# Patient Record
Sex: Female | Born: 2000 | Race: Black or African American | Hispanic: No | Marital: Single | Smoking: Never smoker
Health system: Southern US, Community
[De-identification: ages and names within clinical notes are randomized; demographics above are authoritative.]

## PROBLEM LIST (undated history)

## (undated) DIAGNOSIS — S129XXA Fracture of neck, unspecified, initial encounter: Secondary | ICD-10-CM

---
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
## 2022-01-09 ENCOUNTER — Other Ambulatory Visit (HOSPITAL_COMMUNITY)
Admission: RE | Admit: 2022-01-09 | Discharge: 2022-01-09 | Disposition: A | Discharge status: Home / Self Care | Payer: 59 | Source: Ambulatory Visit | Attending: Obstetrics & Gynecology | Admitting: Obstetrics & Gynecology

## 2022-01-09 ENCOUNTER — Ambulatory Visit (INDEPENDENT_AMBULATORY_CARE_PROVIDER_SITE_OTHER): Payer: 59 | Admitting: Obstetrics & Gynecology

## 2022-01-09 VITALS — BP 106/67 | HR 101 | Wt 145.3 lb

## 2022-01-09 DIAGNOSIS — Z3492 Encounter for supervision of normal pregnancy, unspecified, second trimester: Secondary | ICD-10-CM | POA: Diagnosis not present

## 2022-01-09 DIAGNOSIS — A749 Chlamydial infection, unspecified: Secondary | ICD-10-CM

## 2022-01-09 DIAGNOSIS — Z3A36 36 weeks gestation of pregnancy: Secondary | ICD-10-CM | POA: Diagnosis not present

## 2022-01-09 DIAGNOSIS — O98813 Other maternal infectious and parasitic diseases complicating pregnancy, third trimester: Secondary | ICD-10-CM

## 2022-01-09 NOTE — Progress Notes (Signed)
   PRENATAL VISIT NOTE  Subjective:  Autumn Barr is a 21 y.o. G2P0010 at [redacted]w[redacted]d being seen today for ongoing prenatal care.  She is currently monitored for the following issues for this low-risk pregnancy and has S/P cervical spinal fusion; Supervision of low-risk pregnancy, second trimester; and Chlamydia infection affecting pregnancy in third trimester on their problem list.  Patient reports no complaints.  Contractions: Not present. Vag. Bleeding: None.  Movement: Present. Denies leaking of fluid.   The following portions of the patient's history were reviewed and updated as appropriate: allergies, current medications, past family history, past medical history, past social history, past surgical history and problem list.   Objective:   Vitals:   01/09/22 1353  BP: 106/67  Pulse: (!) 101  Weight: 145 lb 4.8 oz (65.9 kg)    Fetal Status: Fetal Heart Rate (bpm): 150 Fundal Height: 37 cm Movement: Present  Presentation: Vertex  General:  Alert, oriented and cooperative. Patient is in no acute distress.  Skin: Skin is warm and dry. No rash noted.   Cardiovascular: Normal heart rate noted  Respiratory: Normal respiratory effort, no problems with respiration noted  Abdomen: Soft, gravid, appropriate for gestational age.  Pain/Pressure: Present     Pelvic: Cervical exam performed in the presence of a chaperone Dilation: 3 Effacement (%): 70 Station: -2  Extremities: Normal range of motion.  Edema: None  Mental Status: Normal mood and affect. Normal behavior. Normal judgment and thought content.   Assessment and Plan:  Pregnancy: G2P0010 at [redacted]w[redacted]d 1. Chlamydia infection affecting pregnancy in third trimester Had negative TOC, repeat test done today.  2. [redacted] weeks gestation of pregnancy 3. Supervision of low-risk pregnancy, second trimester Pelvic cultures done today. Favorable cervix on exam. - Cervicovaginal ancillary only - Strep Gp B NAA Preterm labor symptoms and general  obstetric precautions including but not limited to vaginal bleeding, contractions, leaking of fluid and fetal movement were reviewed in detail with the patient. Please refer to After Visit Summary for other counseling recommendations.   Return in about 1 week (around 01/16/2022) for OFFICE OB VISIT (MD or APP).  Future Appointments  Date Time Provider [place]  01/09/2022  2:35 PM Osborne Oman, MD [facility]  01/16/2022  9:35 AM Griffin Basil, MD [facility]    Verita Schneiders, MD

## 2022-01-09 NOTE — Patient Instructions (Signed)
Return to office for any scheduled appointments. Call the office or go to the MAU at [facility] if: You begin to have strong, frequent contractions Your water breaks.  Sometimes it is a big gush of fluid, sometimes it is just a trickle that keeps getting your underwear wet or running down your legs You have vaginal bleeding.  It is normal to have a small amount of spotting if your cervix was checked.  You do not feel your baby moving like normal.  If you do not, get something to eat and drink and lay down and focus on feeling your baby move.   If your baby is still not moving like normal, you should call the office or go to MAU. Any other obstetric concerns.  

## 2022-01-11 LAB — CERVICOVAGINAL ANCILLARY ONLY
Chlamydia: NEGATIVE
Comment: NEGATIVE
Comment: NEGATIVE
Comment: NORMAL
Neisseria Gonorrhea: NEGATIVE
Trichomonas: NEGATIVE

## 2022-01-11 LAB — STREP GP B NAA: Strep Gp B NAA: NEGATIVE

## 2022-01-16 ENCOUNTER — Ambulatory Visit (INDEPENDENT_AMBULATORY_CARE_PROVIDER_SITE_OTHER): Payer: 59 | Admitting: Obstetrics and Gynecology

## 2022-01-16 VITALS — BP 123/69 | HR 82 | Wt 146.4 lb

## 2022-01-16 DIAGNOSIS — Z3492 Encounter for supervision of normal pregnancy, unspecified, second trimester: Secondary | ICD-10-CM

## 2022-01-16 DIAGNOSIS — Z981 Arthrodesis status: Secondary | ICD-10-CM

## 2022-01-16 DIAGNOSIS — Z3A37 37 weeks gestation of pregnancy: Secondary | ICD-10-CM

## 2022-01-16 NOTE — Progress Notes (Signed)
   PRENATAL VISIT NOTE  Subjective:  Autumn Barr is a 21 y.o. G2P0010 at [redacted]w[redacted]d being seen today for ongoing prenatal care.  She is currently monitored for the following issues for this low-risk pregnancy and has S/P cervical spinal fusion; Supervision of low-risk pregnancy, second trimester; and Chlamydia infection affecting pregnancy in third trimester on their problem list.  Patient doing well with no acute concerns today. She reports occasional contractions.  Contractions: Irritability. Vag. Bleeding: None.  Movement: Present. Denies leaking of fluid.   The following portions of the patient's history were reviewed and updated as appropriate: allergies, current medications, past family history, past medical history, past social history, past surgical history and problem list. Problem list updated.  Objective:   Vitals:   01/16/22 1018  BP: 123/69  Pulse: 82  Weight: 146 lb 6.4 oz (66.4 kg)    Fetal Status: Fetal Heart Rate (bpm): 155 Fundal Height: 38 cm Movement: Present     General:  Alert, oriented and cooperative. Patient is in no acute distress.  Skin: Skin is warm and dry. No rash noted.   Cardiovascular: Normal heart rate noted  Respiratory: Normal respiratory effort, no problems with respiration noted  Abdomen: Soft, gravid, appropriate for gestational age.  Pain/Pressure: Present     Pelvic: Cervical exam deferred        Extremities: Normal range of motion.  Edema: None  Mental Status:  Normal mood and affect. Normal behavior. Normal judgment and thought content.   Assessment and Plan:  Pregnancy: G2P0010 at [redacted]w[redacted]d  1. [redacted] weeks gestation of pregnancy   2. S/P cervical spinal fusion Discuss with anesthesia upon admission, should be no issue since it does not involve lumbar spine  3. Supervision of low-risk pregnancy, second trimester Continue routine prenatal care  Term labor symptoms and general obstetric precautions including but not limited to vaginal  bleeding, contractions, leaking of fluid and fetal movement were reviewed in detail with the patient.  Please refer to After Visit Summary for other counseling recommendations.   Return in about 1 week (around 01/23/2022) for ROB, in person.   Lynnda Shields, MD Faculty Attending Center for [facility]

## 2022-01-24 ENCOUNTER — Encounter: Payer: Self-pay | Admitting: Obstetrics & Gynecology

## 2022-01-24 ENCOUNTER — Other Ambulatory Visit: Payer: Self-pay

## 2022-01-24 ENCOUNTER — Encounter: Payer: Self-pay | Admitting: Family Medicine

## 2022-01-24 ENCOUNTER — Ambulatory Visit (INDEPENDENT_AMBULATORY_CARE_PROVIDER_SITE_OTHER): Payer: 59 | Admitting: Obstetrics & Gynecology

## 2022-01-24 VITALS — BP 117/72 | HR 82 | Wt 154.6 lb

## 2022-01-24 DIAGNOSIS — Z3493 Encounter for supervision of normal pregnancy, unspecified, third trimester: Secondary | ICD-10-CM

## 2022-01-24 DIAGNOSIS — Z3A38 38 weeks gestation of pregnancy: Secondary | ICD-10-CM

## 2022-01-24 NOTE — Progress Notes (Signed)
   PRENATAL VISIT NOTE  Subjective:  Autumn Barr is a 21 y.o. G2P0010 at [redacted]w[redacted]d being seen today for ongoing prenatal care.  She is currently monitored for the following issues for this low-risk pregnancy and has S/P cervical spinal fusion; Supervision of low-risk pregnancy, third trimester; and Chlamydia infection affecting pregnancy in third trimester on their problem list.  Patient reports occasional contractions.  Contractions: Irritability. Vag. Bleeding: None.  Movement: Present. Denies leaking of fluid.   The following portions of the patient's history were reviewed and updated as appropriate: allergies, current medications, past family history, past medical history, past social history, past surgical history and problem list.   Objective:   Vitals:   01/24/22 0916  BP: 117/72  Pulse: 82  Weight: 154 lb 9.6 oz (70.1 kg)    Fetal Status: Fetal Heart Rate (bpm): 154 Fundal Height: 38 cm Movement: Present  Presentation: Vertex  General:  Alert, oriented and cooperative. Patient is in no acute distress.  Skin: Skin is warm and dry. No rash noted.   Cardiovascular: Normal heart rate noted  Respiratory: Normal respiratory effort, no problems with respiration noted  Abdomen: Soft, gravid, appropriate for gestational age.  Pain/Pressure: Present     Pelvic: Cervical exam performed in the presence of a chaperone Dilation: 3 Effacement (%): 70 Station: -2  Extremities: Normal range of motion.     Mental Status: Normal mood and affect. Normal behavior. Normal judgment and thought content.   Assessment and Plan:  Pregnancy: G2P0010 at [redacted]w[redacted]d 1. [redacted] weeks gestation of pregnancy 2. Supervision of low-risk pregnancy, third trimester Favorable cervix today, patient reassured. Labor symptoms and general obstetric precautions including but not limited to vaginal bleeding, contractions, leaking of fluid and fetal movement were reviewed in detail with the patient. Please refer to After  Visit Summary for other counseling recommendations.   Return in about 1 week (around 01/31/2022) for OFFICE OB VISIT (MD or APP).  No future appointments.  Verita Schneiders, MD

## 2022-01-24 NOTE — Patient Instructions (Signed)
Return to office for any scheduled appointments. Call the office or go to the MAU at [facility] if: You begin to have strong, frequent contractions Your water breaks.  Sometimes it is a big gush of fluid, sometimes it is just a trickle that keeps getting your underwear wet or running down your legs You have vaginal bleeding.  It is normal to have a small amount of spotting if your cervix was checked.  You do not feel your baby moving like normal.  If you do not, get something to eat and drink and lay down and focus on feeling your baby move.   If your baby is still not moving like normal, you should call the office or go to MAU. Any other obstetric concerns.  

## 2022-01-31 NOTE — Progress Notes (Signed)
   PRENATAL VISIT NOTE  Subjective:  Autumn Barr is a 21 y.o. G2P0010 at [redacted]w[redacted]d being seen today for ongoing prenatal care.  She is currently monitored for the following issues for this high-risk pregnancy and has S/P cervical spinal fusion; Supervision of low-risk pregnancy, third trimester; and Chlamydia infection affecting pregnancy in third trimester on their problem list.  Patient reports occasional contractions.  Contractions: Irregular. Vag. Bleeding: None.  Movement: Present. Denies leaking of fluid.   The following portions of the patient's history were reviewed and updated as appropriate: allergies, current medications, past family history, past medical history, past social history, past surgical history and problem list.   Objective:   Vitals:   02/01/22 1034  BP: 121/64  Pulse: 74  Weight: 156 lb 11.2 oz (71.1 kg)    Fetal Status: Fetal Heart Rate (bpm): 154   Movement: Present    Vtx 40 cm   General:  Alert, oriented and cooperative. Patient is in no acute distress.  Skin: Skin is warm and dry. No rash noted.   Cardiovascular: Normal heart rate noted  Respiratory: Normal respiratory effort, no problems with respiration noted  Abdomen: Soft, gravid, appropriate for gestational age.  Pain/Pressure: Present     Pelvic: Cervical exam deferred        Extremities: Normal range of motion.  Edema: Mild pitting, slight indentation  Mental Status: Normal mood and affect. Normal behavior. Normal judgment and thought content.   Assessment and Plan:  Pregnancy: G2P0010 at [redacted]w[redacted]d 1. S/P cervical spinal fusion - Op note reviewed. No restrictions.   2. Chlamydia infection affecting pregnancy in third trimester - TOC neg  3. Supervision of low-risk pregnancy, third trimester   4. [redacted] weeks gestation of pregnancy - IOL 11/20 at 41 weeks  Term labor symptoms and general obstetric precautions including but not limited to vaginal bleeding, contractions, leaking of fluid and  fetal movement were reviewed in detail with the patient. Please refer to After Visit Summary for other counseling recommendations.   No follow-ups on file.  Future Appointments  Date Time Provider Department Center  02/07/2022  8:55 AM Anyanwu, Jethro Bastos, MD [facility]  02/07/2022 10:15 AM [facility]    Dorathy Kinsman, CNM

## 2022-02-01 ENCOUNTER — Ambulatory Visit (INDEPENDENT_AMBULATORY_CARE_PROVIDER_SITE_OTHER): Payer: 59 | Admitting: Advanced Practice Midwife

## 2022-02-01 ENCOUNTER — Other Ambulatory Visit: Payer: Self-pay

## 2022-02-01 VITALS — BP 121/64 | HR 74 | Wt 156.7 lb

## 2022-02-01 DIAGNOSIS — O98813 Other maternal infectious and parasitic diseases complicating pregnancy, third trimester: Secondary | ICD-10-CM

## 2022-02-01 DIAGNOSIS — A749 Chlamydial infection, unspecified: Secondary | ICD-10-CM

## 2022-02-01 DIAGNOSIS — Z981 Arthrodesis status: Secondary | ICD-10-CM

## 2022-02-01 DIAGNOSIS — Z3A39 39 weeks gestation of pregnancy: Secondary | ICD-10-CM

## 2022-02-01 DIAGNOSIS — Z3493 Encounter for supervision of normal pregnancy, unspecified, third trimester: Secondary | ICD-10-CM

## 2022-02-01 NOTE — Addendum Note (Signed)
Addended by: Dorathy Kinsman on: 02/01/2022 12:14 PM   Modules accepted: Orders

## 2022-02-01 NOTE — Patient Instructions (Signed)
[email or web address]  

## 2022-02-05 ENCOUNTER — Inpatient Hospital Stay (HOSPITAL_COMMUNITY)
Admission: AD | Admit: 2022-02-05 | Discharge: 2022-02-07 | DRG: 806 | Disposition: A | Discharge status: Home / Self Care | Payer: 59 | Attending: Obstetrics and Gynecology | Admitting: Obstetrics and Gynecology

## 2022-02-05 ENCOUNTER — Inpatient Hospital Stay (HOSPITAL_COMMUNITY): Payer: 59 | Admitting: Anesthesiology

## 2022-02-05 ENCOUNTER — Other Ambulatory Visit: Payer: Self-pay

## 2022-02-05 ENCOUNTER — Encounter (HOSPITAL_COMMUNITY): Payer: Self-pay | Admitting: Family Medicine

## 2022-02-05 DIAGNOSIS — A749 Chlamydial infection, unspecified: Secondary | ICD-10-CM

## 2022-02-05 DIAGNOSIS — D62 Acute posthemorrhagic anemia: Secondary | ICD-10-CM | POA: Diagnosis not present

## 2022-02-05 DIAGNOSIS — O139 Gestational [pregnancy-induced] hypertension without significant proteinuria, unspecified trimester: Secondary | ICD-10-CM | POA: Diagnosis not present

## 2022-02-05 DIAGNOSIS — O1404 Mild to moderate pre-eclampsia, complicating childbirth: Secondary | ICD-10-CM | POA: Diagnosis present

## 2022-02-05 DIAGNOSIS — Z3A40 40 weeks gestation of pregnancy: Secondary | ICD-10-CM

## 2022-02-05 DIAGNOSIS — O1495 Unspecified pre-eclampsia, complicating the puerperium: Secondary | ICD-10-CM

## 2022-02-05 DIAGNOSIS — O9081 Anemia of the puerperium: Secondary | ICD-10-CM | POA: Diagnosis not present

## 2022-02-05 DIAGNOSIS — O480 Post-term pregnancy: Secondary | ICD-10-CM | POA: Diagnosis present

## 2022-02-05 DIAGNOSIS — O164 Unspecified maternal hypertension, complicating childbirth: Secondary | ICD-10-CM | POA: Diagnosis not present

## 2022-02-05 DIAGNOSIS — O368130 Decreased fetal movements, third trimester, not applicable or unspecified: Secondary | ICD-10-CM | POA: Diagnosis not present

## 2022-02-05 DIAGNOSIS — Z3493 Encounter for supervision of normal pregnancy, unspecified, third trimester: Principal | ICD-10-CM

## 2022-02-05 DIAGNOSIS — O98813 Other maternal infectious and parasitic diseases complicating pregnancy, third trimester: Secondary | ICD-10-CM

## 2022-02-05 LAB — CBC
HCT: 32.5 % — ABNORMAL LOW (ref 36.0–46.0)
HCT: 30.2 % — ABNORMAL LOW (ref 36.0–46.0)
Hemoglobin: 10.4 g/dL — ABNORMAL LOW (ref 12.0–15.0)
Hemoglobin: 9.8 g/dL — ABNORMAL LOW (ref 12.0–15.0)
MCH: 26.8 pg (ref 26.0–34.0)
MCH: 27 pg (ref 26.0–34.0)
MCHC: 32 g/dL (ref 30.0–36.0)
MCHC: 32.5 g/dL (ref 30.0–36.0)
MCV: 83.8 fL (ref 80.0–100.0)
MCV: 83.2 fL (ref 80.0–100.0)
Platelets: 226 10*3/uL (ref 150–400)
Platelets: 206 10*3/uL (ref 150–400)
RBC: 3.88 MIL/uL (ref 3.87–5.11)
RBC: 3.63 MIL/uL — ABNORMAL LOW (ref 3.87–5.11)
RDW: 14.8 % (ref 11.5–15.5)
RDW: 14.7 % (ref 11.5–15.5)
WBC: 8.2 10*3/uL (ref 4.0–10.5)
WBC: 8.5 10*3/uL (ref 4.0–10.5)
nRBC: 0 % (ref 0.0–0.2)
nRBC: 0 % (ref 0.0–0.2)

## 2022-02-05 LAB — URINALYSIS, ROUTINE W REFLEX MICROSCOPIC
Bilirubin Urine: NEGATIVE
Glucose, UA: NEGATIVE mg/dL
Hgb urine dipstick: NEGATIVE
Ketones, ur: NEGATIVE mg/dL
Nitrite: NEGATIVE
Protein, ur: 100 mg/dL — AB
Specific Gravity, Urine: 1.027 (ref 1.005–1.030)
pH: 5 (ref 5.0–8.0)

## 2022-02-05 LAB — COMPREHENSIVE METABOLIC PANEL
ALT: 19 U/L (ref 0–44)
AST: 30 U/L (ref 15–41)
Albumin: 3.2 g/dL — ABNORMAL LOW (ref 3.5–5.0)
Alkaline Phosphatase: 135 U/L — ABNORMAL HIGH (ref 38–126)
Anion gap: 11 (ref 5–15)
BUN: 8 mg/dL (ref 6–20)
CO2: 18 mmol/L — ABNORMAL LOW (ref 22–32)
Calcium: 9.2 mg/dL (ref 8.9–10.3)
Chloride: 107 mmol/L (ref 98–111)
Creatinine, Ser: 0.72 mg/dL (ref 0.44–1.00)
GFR, Estimated: >60 mL/min (ref >60)
Glucose, Bld: 76 mg/dL (ref 70–99)
Potassium: 4 mmol/L (ref 3.5–5.1)
Sodium: 136 mmol/L (ref 135–145)
Total Bilirubin: 0.5 mg/dL (ref 0.3–1.2)
Total Protein: 6.3 g/dL — ABNORMAL LOW (ref 6.5–8.1)

## 2022-02-05 LAB — PROTEIN / CREATININE RATIO, URINE
Creatinine, Urine: 223 mg/dL
Protein Creatinine Ratio: 0.31 mg/mg{Cre} — ABNORMAL HIGH (ref 0.00–0.15)
Total Protein, Urine: 69 mg/dL

## 2022-02-05 MED ORDER — LACTATED RINGERS IV SOLN: 500.0000 mL | INTRAVENOUS | Status: DC | PRN

## 2022-02-05 MED ORDER — PHENYLEPHRINE 80 MCG/ML (10ML) SYRINGE FOR IV PUSH (FOR BLOOD PRESSURE SUPPORT): 80.0000 ug | PREFILLED_SYRINGE | INTRAVENOUS | Status: DC | PRN

## 2022-02-05 MED ORDER — LIDOCAINE HCL (PF) 1 % IJ SOLN
30.0000 mL | INTRAMUSCULAR | Status: AC | PRN
  Administered 2022-02-06: 30 mL via SUBCUTANEOUS
  Filled 2022-02-05: qty 30

## 2022-02-05 MED ORDER — EPHEDRINE 5 MG/ML INJ: 10.0000 mg | INTRAVENOUS | Status: DC | PRN

## 2022-02-05 MED ORDER — DIPHENHYDRAMINE HCL 50 MG/ML IJ SOLN: 12.5000 mg | INTRAMUSCULAR | Status: DC | PRN

## 2022-02-05 MED ORDER — ONDANSETRON HCL 4 MG/2ML IJ SOLN: 4.0000 mg | Freq: Four times a day (QID) | INTRAMUSCULAR | Status: DC | PRN

## 2022-02-05 MED ORDER — ACETAMINOPHEN 325 MG PO TABS: 650.0000 mg | ORAL_TABLET | ORAL | Status: DC | PRN

## 2022-02-05 MED ORDER — OXYTOCIN-SODIUM CHLORIDE 30-0.9 UT/500ML-% IV SOLN
2.5000 [IU]/h | INTRAVENOUS | Status: DC
  Administered 2022-02-06: 2.5 [IU]/h via INTRAVENOUS

## 2022-02-05 MED ORDER — FENTANYL-BUPIVACAINE-NACL 0.5-0.125-0.9 MG/250ML-% EP SOLN
12.0000 mL/h | EPIDURAL | Status: DC | PRN
  Administered 2022-02-05: 12 mL/h via EPIDURAL
  Filled 2022-02-05: qty 250

## 2022-02-05 MED ORDER — OXYTOCIN-SODIUM CHLORIDE 30-0.9 UT/500ML-% IV SOLN
1.0000 m[IU]/min | INTRAVENOUS | Status: DC
  Administered 2022-02-05: 2 m[IU]/min via INTRAVENOUS
  Filled 2022-02-05: qty 500

## 2022-02-05 MED ORDER — OXYTOCIN BOLUS FROM INFUSION
333.0000 mL | Freq: Once | INTRAVENOUS | Status: AC
  Administered 2022-02-06: 333 mL via INTRAVENOUS

## 2022-02-05 MED ORDER — OXYCODONE-ACETAMINOPHEN 5-325 MG PO TABS: 2.0000 | ORAL_TABLET | ORAL | Status: DC | PRN

## 2022-02-05 MED ORDER — LIDOCAINE HCL (PF) 1 % IJ SOLN
INTRAMUSCULAR | Status: DC | PRN
  Administered 2022-02-05: 8 mL via EPIDURAL

## 2022-02-05 MED ORDER — SOD CITRATE-CITRIC ACID 500-334 MG/5ML PO SOLN: 30.0000 mL | ORAL | Status: DC | PRN

## 2022-02-05 MED ORDER — FENTANYL-BUPIVACAINE-NACL 0.5-0.125-0.9 MG/250ML-% EP SOLN: 12.0000 mL/h | EPIDURAL | Status: DC | PRN

## 2022-02-05 MED ORDER — LACTATED RINGERS IV SOLN: INTRAVENOUS | Status: DC

## 2022-02-05 MED ORDER — FLEET ENEMA 7-19 GM/118ML RE ENEM: 1.0000 | ENEMA | RECTAL | Status: DC | PRN

## 2022-02-05 MED ORDER — TERBUTALINE SULFATE 1 MG/ML IJ SOLN: 0.2500 mg | Freq: Once | INTRAMUSCULAR | Status: DC | PRN

## 2022-02-05 MED ORDER — FENTANYL CITRATE (PF) 100 MCG/2ML IJ SOLN
100.0000 ug | INTRAMUSCULAR | Status: DC | PRN
  Administered 2022-02-05: 100 ug via INTRAVENOUS
  Filled 2022-02-05: qty 2

## 2022-02-05 MED ORDER — OXYCODONE-ACETAMINOPHEN 5-325 MG PO TABS: 1.0000 | ORAL_TABLET | ORAL | Status: DC | PRN

## 2022-02-05 MED ORDER — LACTATED RINGERS IV SOLN
500.0000 mL | Freq: Once | INTRAVENOUS | Status: AC
  Administered 2022-02-05: 500 mL via INTRAVENOUS

## 2022-02-05 NOTE — MAU Provider Note (Signed)
Event Date/Time   First Provider Initiated Contact with Patient 02/05/22 1141       S: Ms. DELANO SCARDINO is a 21 y.o. G2P0010 at [redacted]w[redacted]d  who presents to MAU today complaining of regular painful contractions since this am. She is unsure how often. She denies vaginal bleeding. She denies LOF. She reports decreased fetal movement since yesterday. Has not push on her abdomen to make the baby move.  O: BP 129/78   Pulse 81   Temp 98.2 F (36.8 C) (Oral)   Resp 16   Ht 5\' 2"  (1.575 m)   Wt 72.7 kg   LMP 04/30/2021   SpO2 98% Comment: room air  BMI 29.32 kg/m  GENERAL: Well-developed, well-nourished female in no acute distress.  HEAD: Normocephalic, atraumatic.  CHEST: Normal effort of breathing, regular heart rate  Cervical exam:  Dilation: 3 Effacement (%): 60 Cervical Position: Anterior Station: -3 Presentation: Vertex Exam by:: E Johnson RN  Fetal Monitoring: Baseline: 130 Variability: mod Accelerations: no Decelerations: no Contractions: q1-3  A: SIUP at [redacted]w[redacted]d  Non-reactive NST Decreased FM  P: Admit to LD IOL Mngt per labor  [redacted]w[redacted]d, CNM 02/05/2022 11:47 AM

## 2022-02-05 NOTE — Anesthesia Procedure Notes (Signed)
Epidural Patient location during procedure: OB Start time: 02/05/2022 11:23 PM End time: 02/05/2022 11:27 PM  Staffing Anesthesiologist: Bethena Midget, MD  Preanesthetic Checklist Completed: patient identified, IV checked, site marked, risks and benefits discussed, surgical consent, monitors and equipment checked, pre-op evaluation and timeout performed  Epidural Patient position: sitting Prep: DuraPrep and site prepped and draped Patient monitoring: continuous pulse ox and blood pressure Approach: midline Location: L3-L4 Injection technique: LOR air  Needle:  Needle type: Tuohy  Needle gauge: 17 G Needle length: 9 cm and 9 Needle insertion depth: 5 cm Catheter type: closed end flexible Catheter size: 19 Gauge Catheter at skin depth: 10 cm Test dose: negative  Assessment Events: blood not aspirated, injection not painful, no injection resistance, no paresthesia and negative IV test

## 2022-02-05 NOTE — H&P (Addendum)
LABOR AND DELIVERY ADMISSION HISTORY AND PHYSICAL NOTE  Autumn Barr is a 21 y.o. female G2P0010 with IUP at [redacted]w[redacted]d by LMP presenting for IOL for non-reactive NST and decreased fetal movement.  She reports contractions every 15 minutes. Baby is moving again since this AM. She denies leakage of fluid or vaginal bleeding.  Prenatal History/Complications: PNC at [facility] Pregnancy complications:  - Late to prenatal care - Chlamydia in 3rd trimester  Past Medical History: Past Medical History:  Diagnosis Date   Cervical spine fracture (HCC) 07/27/2020   Closed cervical spine fracture [facility])     Past Surgical History: Past Surgical History:  Procedure Laterality Date   ANTERIOR CERVICAL DECOMP/DISCECTOMY FUSION N/A 07/28/2020   Procedure: ANTERIOR CERVICAL DECOMPRESSION/DISCECTOMY FUSION CERVICAL FIVE-SIX;  Surgeon: Tia Alert, MD;  Location: [facility];  Service: Neurosurgery;  Laterality: N/A;    Obstetrical History: OB History     Gravida  2   Para  0   Term  0   Preterm  0   AB  1   Living  0      SAB  1   IAB  0   Ectopic  0   Multiple  0   Live Births  0           Social History: Social History   Socioeconomic History   Marital status: Single    Spouse name: Not on file   Number of children: Not on file   Years of education: Not on file   Highest education level: Not on file  Occupational History   Not on file  Tobacco Use   Smoking status: Never   Smokeless tobacco: Never  Vaping Use   Vaping Use: Never used  Substance and Sexual Activity   Alcohol use: Not Currently   Drug use: Not Currently    Types: Marijuana   Sexual activity: Yes    Birth control/protection: None  Other Topics Concern   Not on file  Social History Narrative   Not on file   Social Determinants of Health   Financial Resource Strain: Not on file  Food Insecurity: No Food Insecurity (02/05/2022)   Hunger Vital Sign    Worried About Running Out of Food in the Last  Year: Never true    Ran Out of Food in the Last Year: Never true  Transportation Needs: No Transportation Needs (02/05/2022)   PRAPARE - Administrator, Civil Service (Medical): No    Lack of Transportation (Non-Medical): No  Physical Activity: Not on file  Stress: Not on file  Social Connections: Not on file    Family History: Family History  Problem Relation Age of Onset   Diabetes Maternal Grandmother     Allergies: No Known Allergies  Medications Prior to Admission  Medication Sig Dispense Refill Last Dose   prenatal vitamin w/FE, FA (PRENATAL 1 + 1) 27-1 MG TABS tablet Take 1 tablet by mouth daily at 12 noon. 30 tablet 11 02/04/2022 at 0900     Review of Systems  All systems reviewed and negative except as stated in HPI  Physical Exam Blood pressure (!) 136/92, pulse 70, temperature 97.9 F (36.6 C), temperature source Oral, resp. rate 18, height 5\' 2"  (1.575 m), weight 72.7 kg, last menstrual period 04/30/2021, SpO2 98 %. General appearance: alert, oriented, NAD Lungs: normal respiratory effort Heart: good peripheral perfusion Abdomen: soft, non-tender; gravid, FH appropriate for GA Extremities: No calf swelling or tenderness Presentation: cephalic Fetal  monitoring: 135/moderate variability/15x15 accels, no decels Uterine activity: CTX q 1-5 min Dilation: 3 Effacement (%): 80 Station: -2 Exam by:: Dr. Westley Chandler  Prenatal labs: ABO, Rh: --/--/A POS (11/14 1201) Antibody: NEG (11/14 1201) Rubella: 2.33 (07/14 1004) RPR: Non Reactive (08/24 1222)  HBsAg: Negative (07/14 1004)  HIV: Non Reactive (08/24 1222)  GC/Chlamydia: Neg/Neg after TOC GBS: Negative/-- (10/18 1700)  2-hr GTT: nml Genetic screening:   NIPS:   Low risk female AFP:   Too late Horizon:Neg x 4 Anatomy US: wnml  Prenatal Transfer Tool  Maternal Diabetes: No Genetic Screening: Normal Maternal Ultrasounds/Referrals: Normal Fetal Ultrasounds or other Referrals:  Referred to  Materal Fetal Medicine , Other: for f/u on anatomy US. On initial anatomy scan, was concerned about bowel and could not visualize. Follow up ultrasound was normal Maternal Substance Abuse:  No Significant Maternal Medications:  None Significant Maternal Lab Results: Group B Strep negative  Results for orders placed or performed during the hospital encounter of 02/05/22 (from the past 24 hour(s))  Urinalysis, Routine w reflex microscopic Urine, Clean Catch   Collection Time: 02/05/22 11:23 AM  Result Value Ref Range   Color, Urine YELLOW YELLOW   APPearance CLOUDY (A) CLEAR   Specific Gravity, Urine 1.027 1.005 - 1.030   pH 5.0 5.0 - 8.0   Glucose, UA NEGATIVE NEGATIVE mg/dL   Hgb urine dipstick NEGATIVE NEGATIVE   Bilirubin Urine NEGATIVE NEGATIVE   Ketones, ur NEGATIVE NEGATIVE mg/dL   Protein, ur 350 (A) NEGATIVE mg/dL   Nitrite NEGATIVE NEGATIVE   Leukocytes,Ua TRACE (A) NEGATIVE   RBC / HPF 0-5 0 - 5 RBC/hpf   WBC, UA 11-20 0 - 5 WBC/hpf   Bacteria, UA MANY (A) NONE SEEN   Squamous Epithelial / LPF 11-20 0 - 5   Mucus PRESENT   CBC   Collection Time: 02/05/22 12:01 PM  Result Value Ref Range   WBC 8.2 4.0 - 10.5 K/uL   RBC 3.88 3.87 - 5.11 MIL/uL   Hemoglobin 10.4 (L) 12.0 - 15.0 g/dL   HCT 09.3 (L) 81.8 - 29.9 %   MCV 83.8 80.0 - 100.0 fL   MCH 26.8 26.0 - 34.0 pg   MCHC 32.0 30.0 - 36.0 g/dL   RDW 37.1 69.6 - 78.9 %   Platelets 226 150 - 400 K/uL   nRBC 0.0 0.0 - 0.2 %  Type and screen   Collection Time: 02/05/22 12:01 PM  Result Value Ref Range   ABO/RH(D) A POS    Antibody Screen NEG    Sample Expiration      02/08/2022,2359 Performed at [facility], [address], [place], [place] [address]     Patient Active Problem List   Diagnosis Date Noted   Post-dates pregnancy 02/05/2022   Chlamydia infection affecting pregnancy in third trimester 11/15/2021   Supervision of low-risk pregnancy, third trimester 10/05/2021   S/P cervical spinal fusion  07/28/2020    Assessment: Autumn Barr is a 21 y.o. G2P0010 at [redacted]w[redacted]d here for IOL for non-reactive NST and decreased fetal movement  #Labor: Contractions. Cervix mostly unchanged from MAU this morning. Will start pitocin 2x2. #Pain: Epidural #FWB: Cat 1 CTM. Baby is now moving. #ID:  GBS negative. Tdap and flu vaccines given prenatally. #MOF: Both #MOC: Nexplanon #Circ:  Female, n/a #BP: Had several high blood pressures. Ordered CMP  Linzie Collin 02/05/2022, 7:53 PM  CNM attestation:  I have seen and examined this patient; I agree with above  documentation in the resident's note.   Autumn Barr is a 21 y.o. G2P0010 here for IOL initially for DFM/nonreactive NST; currently has a reactive NST and is feeling the baby move; no s/s pre-e  PE: BP 135/81   Pulse 71   Temp 97.9 F (36.6 C) (Oral)   Resp 19   Ht 5\' 2"  (1.575 m)   Wt 72.7 kg   LMP 04/30/2021   SpO2 98% Comment: room air  BMI 29.32 kg/m  Gen: calm comfortable, NAD Resp: normal effort, no distress Abd: gravid  ROS, labs, PMH reviewed  Plan: -Admit to L&D -Now with mild range BP elevations x 2, >4hrs apart; no symptoms pre-e> will collect labs -Plan Pitocin for induction method as cx is unchanged from MAU exam (3cm) -Anticipate vag del  06/28/2021 CNM 02/05/2022, 8:03 PM

## 2022-02-05 NOTE — MAU Note (Signed)
Autumn Barr is a 21 y.o. at [redacted]w[redacted]d here in MAU reporting: contractions started around 6 this morning. They are every 15 minutes. No bleeding. No LOF. DFM this morning.  Onset of complaint: today  Pain score: 8/10  Vitals:   02/05/22 1047  BP: (!) 146/81  Pulse: 85  Resp: 16  Temp: 98.2 F (36.8 C)  SpO2: 98%     FHT:140  Lab orders placed from triage: UA

## 2022-02-05 NOTE — MAU Note (Signed)
Pt informed that the ultrasound is considered a limited OB ultrasound and is not intended to be a complete ultrasound exam.  Patient also informed that the ultrasound is not being completed with the intent of assessing for fetal or placental anomalies or any pelvic abnormalities.  Explained that the purpose of today's ultrasound is to assess for  presentation.  Patient acknowledges the purpose of the exam and the limitations of the study. Patient is vertex by bedside US.

## 2022-02-05 NOTE — Anesthesia Preprocedure Evaluation (Signed)
Anesthesia Evaluation  Patient identified by MRN, date of birth, ID band Patient awake    Reviewed: Allergy & Precautions, H&P , NPO status , Patient's Chart, lab work & pertinent test results, reviewed documented beta blocker date and time   Airway Mallampati: I  TM Distance: >3 FB Neck ROM: full    Dental no notable dental hx. (+) Teeth Intact, Dental Advisory Given   Pulmonary neg pulmonary ROS   Pulmonary exam normal breath sounds clear to auscultation       Cardiovascular hypertension, Pt. on medications Normal cardiovascular exam Rhythm:regular Rate:Normal     Neuro/Psych negative neurological ROS  negative psych ROS   GI/Hepatic negative GI ROS, Neg liver ROS,,,  Endo/Other  negative endocrine ROS    Renal/GU negative Renal ROS  negative genitourinary   Musculoskeletal   Abdominal   Peds  Hematology negative hematology ROS (+)   Anesthesia Other Findings   Reproductive/Obstetrics (+) Pregnancy                             Anesthesia Physical Anesthesia Plan  ASA: 2  Anesthesia Plan: Epidural   Post-op Pain Management:    Induction:   PONV Risk Score and Plan: 2  Airway Management Planned: Natural Airway  Additional Equipment: None  Intra-op Plan:   Post-operative Plan:   Informed Consent: I have reviewed the patients History and Physical, chart, labs and discussed the procedure including the risks, benefits and alternatives for the proposed anesthesia with the patient or authorized representative who has indicated his/her understanding and acceptance.     Dental Advisory Given  Plan Discussed with: Anesthesiologist and CRNA  Anesthesia Plan Comments: (Labs checked- platelets confirmed with RN in room. Fetal heart tracing, per RN, reported to be stable enough for sitting procedure. Discussed epidural, and patient consents to the procedure:  included risk of  possible headache,backache, failed block, allergic reaction, and nerve injury. This patient was asked if she had any questions or concerns before the procedure started.)       Anesthesia Quick Evaluation

## 2022-02-06 ENCOUNTER — Encounter (HOSPITAL_COMMUNITY): Payer: Self-pay | Admitting: Family Medicine

## 2022-02-06 DIAGNOSIS — Z3A40 40 weeks gestation of pregnancy: Secondary | ICD-10-CM

## 2022-02-06 DIAGNOSIS — O480 Post-term pregnancy: Secondary | ICD-10-CM

## 2022-02-06 LAB — RPR: RPR Ser Ql: NONREACTIVE

## 2022-02-06 MED ORDER — ONDANSETRON HCL 4 MG/2ML IJ SOLN: 4.0000 mg | INTRAMUSCULAR | Status: DC | PRN

## 2022-02-06 MED ORDER — ACETAMINOPHEN 325 MG PO TABS: 650.0000 mg | ORAL_TABLET | ORAL | Status: DC | PRN

## 2022-02-06 MED ORDER — PRENATAL MULTIVITAMIN CH
1.0000 | ORAL_TABLET | Freq: Every day | ORAL | Status: DC
  Administered 2022-02-06 – 2022-02-07 (×2): 1 via ORAL
  Filled 2022-02-06 (×2): qty 1

## 2022-02-06 MED ORDER — IBUPROFEN 600 MG PO TABS
600.0000 mg | ORAL_TABLET | Freq: Four times a day (QID) | ORAL | Status: DC
  Administered 2022-02-06 – 2022-02-07 (×6): 600 mg via ORAL
  Filled 2022-02-06 (×6): qty 1

## 2022-02-06 MED ORDER — SODIUM CHLORIDE 0.9% FLUSH: 3.0000 mL | INTRAVENOUS | Status: DC | PRN

## 2022-02-06 MED ORDER — TETANUS-DIPHTH-ACELL PERTUSSIS 5-2.5-18.5 LF-MCG/0.5 IM SUSY: 0.5000 mL | PREFILLED_SYRINGE | Freq: Once | INTRAMUSCULAR | Status: DC

## 2022-02-06 MED ORDER — OXYCODONE HCL 5 MG PO TABS: 10.0000 mg | ORAL_TABLET | ORAL | Status: DC | PRN

## 2022-02-06 MED ORDER — OXYCODONE HCL 5 MG PO TABS: 5.0000 mg | ORAL_TABLET | ORAL | Status: DC | PRN

## 2022-02-06 MED ORDER — SODIUM CHLORIDE 0.9 % IV SOLN: INTRAVENOUS | Status: DC | PRN

## 2022-02-06 MED ORDER — SENNOSIDES-DOCUSATE SODIUM 8.6-50 MG PO TABS
2.0000 | ORAL_TABLET | ORAL | Status: DC
  Administered 2022-02-06 – 2022-02-07 (×2): 2 via ORAL
  Filled 2022-02-06 (×2): qty 2

## 2022-02-06 MED ORDER — SODIUM CHLORIDE 0.9% FLUSH
3.0000 mL | Freq: Two times a day (BID) | INTRAVENOUS | Status: DC
  Administered 2022-02-07: 3 mL via INTRAVENOUS

## 2022-02-06 MED ORDER — WITCH HAZEL-GLYCERIN EX PADS: 1.0000 | MEDICATED_PAD | CUTANEOUS | Status: DC | PRN

## 2022-02-06 MED ORDER — ONDANSETRON HCL 4 MG PO TABS: 4.0000 mg | ORAL_TABLET | ORAL | Status: DC | PRN

## 2022-02-06 MED ORDER — SIMETHICONE 80 MG PO CHEW: 80.0000 mg | CHEWABLE_TABLET | ORAL | Status: DC | PRN

## 2022-02-06 MED ORDER — FUROSEMIDE 20 MG PO TABS
20.0000 mg | ORAL_TABLET | Freq: Every day | ORAL | Status: DC
  Administered 2022-02-06 – 2022-02-07 (×2): 20 mg via ORAL
  Filled 2022-02-06 (×2): qty 1

## 2022-02-06 MED ORDER — MEASLES, MUMPS & RUBELLA VAC IJ SOLR: 0.5000 mL | Freq: Once | INTRAMUSCULAR | Status: DC

## 2022-02-06 MED ORDER — DIBUCAINE (PERIANAL) 1 % EX OINT: 1.0000 | TOPICAL_OINTMENT | CUTANEOUS | Status: DC | PRN

## 2022-02-06 MED ORDER — COCONUT OIL OIL: 1.0000 | TOPICAL_OIL | Status: DC | PRN

## 2022-02-06 MED ORDER — DIPHENHYDRAMINE HCL 25 MG PO CAPS: 25.0000 mg | ORAL_CAPSULE | Freq: Four times a day (QID) | ORAL | Status: DC | PRN

## 2022-02-06 MED ORDER — ZOLPIDEM TARTRATE 5 MG PO TABS: 5.0000 mg | ORAL_TABLET | Freq: Every evening | ORAL | Status: DC | PRN

## 2022-02-06 MED ORDER — BENZOCAINE-MENTHOL 20-0.5 % EX AERO
1.0000 | INHALATION_SPRAY | CUTANEOUS | Status: DC | PRN
  Filled 2022-02-06: qty 56

## 2022-02-06 NOTE — Lactation Note (Signed)
This note was copied from a baby's chart. Lactation Consultation Note  Patient Name: Autumn Barr [id number] Date: 02/06/2022 Reason for consult: Initial assessment;Primapara;1st time breastfeeding;Term;Breastfeeding assistance Age:19 hours  LC entered the room and the infant was STS with the birth parent.  Per the birth parent, things have been going well with breastfeeding.  She stated that the infant has latched, but she has not seen a lot of milk.  LC spoke with the birth parent about milk production in the early days of life.  LC demonstrated hand expression to the birth parent. LC demonstrated how to use the manual pump and spoke with the support person about washing pump parts.  The parents stated that they had no questions or concerns about breastfeeding.   Feeding Plan:  Breastfeed 8+ times in 24 hours according to feeding cues.  Hand express for stimulation and spoon feed the infant the expressed milk.  Call RN/LC for assistance with breastfeeding.  Maternal Data Has patient been taught Hand Expression?: Yes Does the patient have breastfeeding experience prior to this delivery?: No  Feeding Mother's Current Feeding Choice: Breast Milk and Formula  Interventions Interventions: Breast feeding basics reviewed;Education;LC Services brochure  Discharge Pump: Manual;Personal  Consult Status Consult Status: Follow-up Date: 02/07/22 Follow-up type: In-patient    Orvil Feil Kenneshia Rehm 02/06/2022, 10:12 AM

## 2022-02-06 NOTE — Lactation Note (Signed)
This note was copied from a baby's chart. Lactation Consultation Note  Patient Name: Autumn Barr ASTMH'D Date: 02/06/2022   Age:19 hours Per RN, Birth Parent is having a repair, RN will call when Birth Parent is ready for [facility] services in L&D.  Maternal Data    Feeding    LATCH Score                    Lactation Tools Discussed/Used    Interventions    Discharge    Consult Status      Frederico Hamman 02/06/2022, 1:31 AM

## 2022-02-06 NOTE — Plan of Care (Signed)
  Problem: Education: Goal: Knowledge of Childbirth will improve Outcome: Completed/Met Goal: Ability to make informed decisions regarding treatment and plan of care will improve Outcome: Completed/Met Goal: Ability to state and carry out methods to decrease the pain will improve Outcome: Completed/Met Goal: Individualized Educational Video(s) Outcome: Completed/Met

## 2022-02-06 NOTE — Anesthesia Postprocedure Evaluation (Signed)
Anesthesia Post Note  Patient: Autumn Barr  Procedure(s) Performed: AN AD HOC LABOR EPIDURAL     Patient location during evaluation: Mother Baby Anesthesia Type: Epidural Level of consciousness: awake and alert Pain management: pain level controlled Vital Signs Assessment: post-procedure vital signs reviewed and stable Respiratory status: spontaneous breathing, nonlabored ventilation and respiratory function stable Cardiovascular status: stable Postop Assessment: no headache, no backache and epidural receding Anesthetic complications: no  No notable events documented.  Last Vitals:  Vitals:   02/06/22 0000 02/06/22 0030  BP: 122/75 (!) 135/101  Pulse: 67 84  Resp: 19   Temp:    SpO2:      Last Pain:  Vitals:   02/06/22 0000  TempSrc:   PainSc: 0-No pain   Pain Goal:                   [name]

## 2022-02-06 NOTE — Discharge Summary (Signed)
Postpartum Discharge Summary  Date of Service updated***     Patient Name: Autumn Barr DOB: [birth year] MRN: [id number]  Date of admission: 02/05/2022 Delivery date:02/06/2022  Delivering provider: Stormy Card  Date of discharge: 02/06/2022  Admitting diagnosis: Post-dates pregnancy [O48.0] Intrauterine pregnancy: [redacted]w[redacted]d    Secondary diagnosis:  Principal Problem:   Vaginal delivery Active Problems:   Post-dates pregnancy   Gestational hypertension  Additional problems: ***    Discharge diagnosis: {DX.:23714}                                              Post partum procedures:{Postpartum procedures:23558} Augmentation: Pitocin Complications: None  Hospital course: Induction of Labor With Vaginal Delivery   21y.o. yo G2P0010 at 49w2das admitted to the hospital 02/05/2022 for induction of labor.  Indication for induction: Gestational hypertension and non reactive NST .  Patient had an uncomplicated labor course Membrane Rupture Time/Date: 11:31 PM ,02/06/2022   Delivery Method:Vaginal, Spontaneous  Episiotomy: None  Lacerations:  Perineal;1st degree  Details of delivery can be found in separate delivery note.  Patient had a postpartum course complicated by***. Patient is discharged home 02/06/22.  Newborn Data: Birth date:02/06/2022  Birth time:1:03 AM  Gender:Female  Living status:Living  Apgars:9 ,9  Weight:3490 g   Magnesium Sulfate received: No BMZ received: No Rhophylac:N/A MMR:N/A, Immune T-DaP:Given prenatally Flu: Given prenatally Transfusion:{Transfusion received:30440034}  Physical exam  Vitals:   02/06/22 0116 02/06/22 0131 02/06/22 0150 02/06/22 0225  BP: (!) 130/92 128/83 129/78 125/70  Pulse: (!) 101 99 98 90  Resp:      Temp:      TempSrc:      SpO2:      Weight:      Height:       General: {Exam; general:21111117} Lochia: {Desc; appropriate/inappropriate:30686::"appropriate"} Uterine Fundus: {Desc;  firm/soft:30687} Incision: {Exam; incision:21111123} DVT Evaluation: {Exam; dvt:2111122} Labs: Lab Results  Component Value Date   WBC 8.5 02/05/2022   HGB 9.8 (L) 02/05/2022   HCT 30.2 (L) 02/05/2022   MCV 83.2 02/05/2022   PLT 206 02/05/2022      Latest Ref Rng & Units 02/05/2022   12:18 PM  CMP  Glucose 70 - 99 mg/dL 76   BUN 6 - 20 mg/dL 8   Creatinine 0.44 - 1.00 mg/dL 0.72   Sodium 135 - 145 mmol/L 136   Potassium 3.5 - 5.1 mmol/L 4.0   Chloride 98 - 111 mmol/L 107   CO2 22 - 32 mmol/L 18   Calcium 8.9 - 10.3 mg/dL 9.2   Total Protein 6.5 - 8.1 g/dL 6.3   Total Bilirubin 0.3 - 1.2 mg/dL 0.5   Alkaline Phos 38 - 126 U/L 135   AST 15 - 41 U/L 30   ALT 0 - 44 U/L 19    Edinburgh Score:     No data to display           After visit meds:  Allergies as of 02/06/2022   No Known Allergies   Med [facility] must be completed [facility] to [facility]        Discharge home in stable condition Infant Feeding: {Baby feeding:23562} Infant Disposition:{CHL IP OB HOME WITH MOEKCMKL:49179}ischarge instruction: per After Visit Summary and Postpartum booklet. Activity: Advance as tolerated. Pelvic rest for 6 weeks.  Diet: {OB diet:21111121} Future  Appointments: Future Appointments  Date Time Provider [place]  02/07/2022  8:55 AM Anyanwu, Sallyanne Havers, MD [facility]  02/07/2022 10:15 AM [facility] N[facility]   Follow up Visit:  The following message was sent to [facility] by Mikki Santee, MD  Please schedule this patient for a In person postpartum visit in 4 weeks with the following provider: Any provider. Additional Postpartum F/U:BP check 1 week  High risk pregnancy complicated by: gHTN noted just prior to delivery Delivery mode:  Vaginal, Spontaneous  Anticipated Birth Control:  Nexplanon pp planned prior to discharge   02/06/2022 Nahdia Doucet Sherrilyn Rist, MD

## 2022-02-07 ENCOUNTER — Other Ambulatory Visit: Payer: Self-pay

## 2022-02-07 ENCOUNTER — Other Ambulatory Visit (HOSPITAL_COMMUNITY): Payer: Self-pay

## 2022-02-07 ENCOUNTER — Encounter: Payer: Self-pay | Admitting: Obstetrics & Gynecology

## 2022-02-07 LAB — CBC
HCT: 20.3 % — ABNORMAL LOW (ref 36.0–46.0)
HCT: 27 % — ABNORMAL LOW (ref 36.0–46.0)
Hemoglobin: 6.5 g/dL — CL (ref 12.0–15.0)
Hemoglobin: 8.7 g/dL — ABNORMAL LOW (ref 12.0–15.0)
MCH: 26.7 pg (ref 26.0–34.0)
MCH: 27.4 pg (ref 26.0–34.0)
MCHC: 32 g/dL (ref 30.0–36.0)
MCHC: 32.2 g/dL (ref 30.0–36.0)
MCV: 83.5 fL (ref 80.0–100.0)
MCV: 85.2 fL (ref 80.0–100.0)
Platelets: 157 10*3/uL (ref 150–400)
Platelets: 196 10*3/uL (ref 150–400)
RBC: 2.43 MIL/uL — ABNORMAL LOW (ref 3.87–5.11)
RBC: 3.17 MIL/uL — ABNORMAL LOW (ref 3.87–5.11)
RDW: 15 % (ref 11.5–15.5)
RDW: 14.7 % (ref 11.5–15.5)
WBC: 6.3 10*3/uL (ref 4.0–10.5)
WBC: 9.6 10*3/uL (ref 4.0–10.5)
nRBC: 0 % (ref 0.0–0.2)
nRBC: 0 % (ref 0.0–0.2)

## 2022-02-07 LAB — PREPARE RBC (CROSSMATCH)

## 2022-02-07 LAB — COMPREHENSIVE METABOLIC PANEL
ALT: 18 U/L (ref 0–44)
AST: 30 U/L (ref 15–41)
Albumin: 2.6 g/dL — ABNORMAL LOW (ref 3.5–5.0)
Alkaline Phosphatase: 98 U/L (ref 38–126)
Anion gap: 5 (ref 5–15)
BUN: <5 mg/dL — ABNORMAL LOW (ref 6–20)
CO2: 22 mmol/L (ref 22–32)
Calcium: 8.6 mg/dL — ABNORMAL LOW (ref 8.9–10.3)
Chloride: 113 mmol/L — ABNORMAL HIGH (ref 98–111)
Creatinine, Ser: 0.62 mg/dL (ref 0.44–1.00)
GFR, Estimated: >60 mL/min (ref >60)
Glucose, Bld: 106 mg/dL — ABNORMAL HIGH (ref 70–99)
Potassium: 3.5 mmol/L (ref 3.5–5.1)
Sodium: 140 mmol/L (ref 135–145)
Total Bilirubin: 0.6 mg/dL (ref 0.3–1.2)
Total Protein: 5.4 g/dL — ABNORMAL LOW (ref 6.5–8.1)

## 2022-02-07 LAB — ABO/RH: ABO/RH(D): A POS

## 2022-02-07 MED ORDER — SODIUM CHLORIDE 0.9% IV SOLUTION: Freq: Once | INTRAVENOUS | Status: AC

## 2022-02-07 MED ORDER — ACETAMINOPHEN 325 MG PO TABS
650.0000 mg | ORAL_TABLET | ORAL | 0 refills | Status: AC | PRN
  Filled 2022-02-07: qty 30, 3d supply, fill #0

## 2022-02-07 MED ORDER — NIFEDIPINE ER 30 MG PO TB24
30.0000 mg | ORAL_TABLET | Freq: Every day | ORAL | 0 refills | Status: DC
  Filled 2022-02-07: qty 30, 30d supply, fill #0

## 2022-02-07 MED ORDER — IBUPROFEN 600 MG PO TABS
600.0000 mg | ORAL_TABLET | Freq: Four times a day (QID) | ORAL | 0 refills | Status: AC
  Filled 2022-02-07: qty 30, 8d supply, fill #0

## 2022-02-07 MED ORDER — FERROUS SULFATE 325 (65 FE) MG PO TABS
325.0000 mg | ORAL_TABLET | Freq: Every day | ORAL | 0 refills | Status: DC
  Filled 2022-02-07: qty 30, 30d supply, fill #0

## 2022-02-07 MED ORDER — FUROSEMIDE 20 MG PO TABS
20.0000 mg | ORAL_TABLET | Freq: Every day | ORAL | 0 refills | Status: DC
  Filled 2022-02-07: qty 3, 3d supply, fill #0

## 2022-02-07 NOTE — Progress Notes (Signed)
Pt was initially ordered [facility], but was changed to [facility] with possible need for a second. The order for [facility] was canceled by Dr. Ashok Pall while the first unit was transfusing and changed to a single unit. 1st PRBC was complete at 1000. Will leave additional single unit in the chart until post blood labs are complete.

## 2022-02-07 NOTE — Progress Notes (Signed)
POSTPARTUM PROGRESS NOTE  Post Partum Day 1  Subjective:  Autumn Barr is a 21 y.o. G2P1011 s/p SVD at [redacted]w[redacted]d.  She reports lightheaded dizziness and fatigue. No acute events overnight. She denies any problems with ambulating, voiding or po intake. Denies nausea or vomiting.  Pain is well controlled.  Lochia is moderate (level of a menstrual period).  Objective: Blood pressure (!) 127/91, pulse 72, temperature 98.3 F (36.8 C), temperature source Oral, resp. rate 18, height 5\' 2"  (1.575 m), weight 72.7 kg, last menstrual period 04/30/2021, SpO2 100 %, unknown if currently breastfeeding.  Physical Exam:  General: alert, cooperative and no distress Chest: no respiratory distress Heart:regular rate, distal pulses intact Abdomen: soft, nontender,  Uterine Fundus: firm, appropriately tender DVT Evaluation: No calf swelling or tenderness Extremities: no edema Skin: warm, dry  Recent Labs    02/05/22 2250 02/07/22 0427  HGB 9.8* 6.5*  HCT 30.2* 20.3*    Assessment/Plan: Autumn Barr is a 21 y.o. G2P1011 s/p SVD at [redacted]w[redacted]d.  PPD#1 - Doing well  Routine postpartum care -Symptomatic Anemia: pt with hgb drop from 10.4 at admit to 6.5. Pt symptomatic so will transfuse 1uPRBC with possible need for a second. Pt without significant ongoing bleed and no PPH.  -New onset PreE without SF diagnosed by elevated UPC ratio >0.3. PP pressures have been normotensive to mild range and pt asymptomatic. Continue Lasix and monitor pressures.  -Contraception: outpatient Mirena -Feeding: breast and bottle -Dispo: Plan for discharge PPD2.   LOS: 2 days   [redacted]w[redacted]d, MD  PGY-3, [facility] 02/07/2022, 8:49 AM

## 2022-02-07 NOTE — Lactation Note (Signed)
This note was copied from a baby's chart. Lactation Consultation Note  Patient Name: Girl Presleigh Feldstein QQIWL'N Date: 02/07/2022 Reason for consult: Follow-up assessment Age:19 hours   LC went into room prior to DC.  Parent's maternal grandmother asked about pump resources.  Pt. Has employee [employer].  Form completed and pt. Provided with Sonata pump.  Reviewed importance of stimulating milk supply when bottle feeding.   Encouraged STS and feeding with cues and reaching out for OP LC support through [facility].  Resource sheet reviewed.   Maternal Data    Feeding Mother's Current Feeding Choice: Breast Milk and Formula  LATCH Score                    Lactation Tools Discussed/Used    Interventions    Discharge Discharge Education: Outpatient recommendation Pump: Employee Pump  Consult Status Consult Status: Complete Date: 02/07/22 Follow-up type: In-patient    Maryruth [facility] 02/07/2022, 5:12 PM

## 2022-02-09 LAB — BPAM RBC
Blood Product Expiration Date: 202311232359
Blood Product Expiration Date: 202312012359
ISSUE DATE / TIME: 202311151611
ISSUE DATE / TIME: 202311160744
Unit Type and Rh: 6200
Unit Type and Rh: 6200

## 2022-02-09 LAB — TYPE AND SCREEN
ABO/RH(D): A POS
Antibody Screen: NEGATIVE
Unit division: 0
Unit division: 0

## 2022-02-13 ENCOUNTER — Ambulatory Visit (INDEPENDENT_AMBULATORY_CARE_PROVIDER_SITE_OTHER): Payer: 59

## 2022-02-13 ENCOUNTER — Other Ambulatory Visit: Payer: Self-pay

## 2022-02-13 VITALS — BP 133/80 | HR 84 | Wt 139.2 lb

## 2022-02-13 DIAGNOSIS — Z0130 Encounter for examination of blood pressure without abnormal findings: Secondary | ICD-10-CM

## 2022-02-13 NOTE — Progress Notes (Signed)
Pt here today for BP check following 1 week PP of NSVD on 11/15. Pt states only having mild headaches that resolve with Tylenol. Denies visual changes and no swelling noted today on exam. Pt has hx of GHTN. Pt states not taking BP med today, took last yesterday afternoon.   BP today: 133/80  Reviewed with Dr Macon Large. Advised pt to keep taking BP med daily until PP visit. Gave PP HTN warnings to patient on when to return to MAU if needed. Pt agreeable with plan of care and verbalized understanding. Pt has PP appt on 03/20/22 with Dr Donavan Foil.   Judeth Cornfield, RNC

## 2022-02-22 DIAGNOSIS — Z419 Encounter for procedure for purposes other than remedying health state, unspecified: Secondary | ICD-10-CM | POA: Diagnosis not present

## 2022-03-07 ENCOUNTER — Encounter: Payer: Self-pay | Admitting: *Deleted

## 2022-03-20 ENCOUNTER — Other Ambulatory Visit: Payer: Self-pay

## 2022-03-20 ENCOUNTER — Ambulatory Visit (INDEPENDENT_AMBULATORY_CARE_PROVIDER_SITE_OTHER): Payer: 59 | Admitting: Family Medicine

## 2022-03-20 DIAGNOSIS — Z30017 Encounter for initial prescription of implantable subdermal contraceptive: Secondary | ICD-10-CM | POA: Diagnosis not present

## 2022-03-20 LAB — POCT PREGNANCY, URINE: Preg Test, Ur: NEGATIVE

## 2022-03-20 MED ORDER — ETONOGESTREL 68 MG ~~LOC~~ IMPL
68.0000 mg | DRUG_IMPLANT | Freq: Once | SUBCUTANEOUS | Status: AC
  Administered 2022-03-20: 68 mg via SUBCUTANEOUS

## 2022-03-20 NOTE — Progress Notes (Signed)
Nexplanon Insertion:  Patient given informed consent, signed copy in the chart, time out was performed. Pregnancy test was negative. Appropriate time out taken.  Patient's left arm was prepped and draped in the usual sterile fashion.. The ruler used to measure and mark insertion area.  Pt was prepped with alcohol swab and then injected with 5 cc of 2% lidocaine with epinephrine.  Pt was prepped with betadine, Implanon removed form packaging,  Device confirmed in needle, then inserted full length of needle and withdrawn per handbook instructions.  Device palpated by physician and patient.  Pt insertion site covered with pressure dressing.   Minimal blood loss.  Pt tolerated the procedure well.

## 2022-03-20 NOTE — Progress Notes (Signed)
Post Partum Visit Note  Autumn Barr is a 21 y.o. G84P1011 female who presents for a postpartum visit. She is 6 weeks postpartum following a normal spontaneous vaginal delivery.  I have fully reviewed the prenatal and intrapartum course. The delivery was at [redacted]w[redacted]d gestational weeks.  Anesthesia: epidural. Postpartum course has been normal. Baby is doing well. Baby is feeding by formula- Enfamil. Bleeding no bleeding. Bowel function is normal. Bladder function is normal. Patient is sexually active. Contraception method is Nexplanon. Postpartum depression screening: negative.   The pregnancy intention screening data noted above was reviewed. Potential methods of contraception were discussed. The patient elected to proceed with No data recorded.   Edinburgh Postnatal Depression Scale - 03/20/22 1513       Edinburgh Postnatal Depression Scale:  In the Past 7 Days   I have been able to laugh and see the funny side of things. 0    I have looked forward with enjoyment to things. 0    I have blamed myself unnecessarily when things went wrong. 0    I have been anxious or worried for no good reason. 0    I have felt scared or panicky for no good reason. 0    Things have been getting on top of me. 0    I have been so unhappy that I have had difficulty sleeping. 0    I have felt sad or miserable. 0    I have been so unhappy that I have been crying. 0    The thought of harming myself has occurred to me. 0    Edinburgh Postnatal Depression Scale Total 0             There are no preventive care reminders to display for this patient.  The following portions of the patient's history were reviewed and updated as appropriate: allergies, current medications, past family history, past medical history, past social history, past surgical history, and problem list.  Review of Systems Pertinent items are noted in HPI.  Objective:  BP 124/84   Pulse 86    General:  alert, cooperative, and no  distress   Breasts:  normal  Lungs: clear to auscultation bilaterally  Heart:  regular rate and rhythm, S1, S2 normal, no murmur, click, rub or gallop  Abdomen: soft, non-tender; bowel sounds normal; no masses,  no organomegaly   Wound N/a  GU exam:  not indicated       Assessment:   .1. Postpartum exam  2. Encounter for initial prescription of Nexplanon See procedure note   Plan:   Essential components of care per ACOG recommendations:  1.  Mood and well being: Patient with negative depression screening today. Reviewed local resources for support.  - Patient tobacco use? No.   - hx of drug use? No.    2. Infant care and feeding:  -Patient currently breastmilk feeding? No.  -Social determinants of health (SDOH) reviewed in EPIC. No concerns  3. Sexuality, contraception and birth spacing - Patient does not want a pregnancy in the next year.  - Reviewed reproductive life planning. Reviewed contraceptive methods based on pt preferences and effectiveness.  Patient desired Hormonal Implant today.   - Discussed birth spacing of 18 months  4. Sleep and fatigue -Encouraged family/partner/community support of 4 hrs of uninterrupted sleep to help with mood and fatigue  5. Physical Recovery  - Discussed patients delivery and complications. She describes her labor as good. - Patient had a Vaginal,  no problems at delivery. Patient had a 1st degree laceration. Perineal healing reviewed. Patient expressed understanding - Patient has urinary incontinence? No. - Patient is safe to resume physical and sexual activity  6.  Health Maintenance - HM due items addressed Yes - Last pap smear No results found for: "DIAGPAP" Pap smear not done at today's visit.  -Breast Cancer screening indicated? No.   7. Chronic Disease/Pregnancy Condition follow up: None  - PCP follow up  Levie Heritage, DO Center for [facility], [facility]

## 2022-03-25 DIAGNOSIS — Z419 Encounter for procedure for purposes other than remedying health state, unspecified: Secondary | ICD-10-CM | POA: Diagnosis not present

## 2022-04-25 DIAGNOSIS — Z419 Encounter for procedure for purposes other than remedying health state, unspecified: Secondary | ICD-10-CM | POA: Diagnosis not present
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
## 2022-05-16 IMAGING — RF DG C-ARM 1-60 MIN
1 series · 2 of 2 positions shown · non-contrast
Comparison: CT angiogram of the neck 07/27/2020. Cervical spine MRI
07/26/2020.

CLINICAL DATA: Surgery, elective. Additional history provided:
ACDF. Provided fluoroscopy time 20 seconds (1.15 mGy).

EXAM:
DG CERVICAL SPINE - 1 VIEW; DG C-ARM 1-60 MIN

[Series 1: run · 2 of 2 slices shown]
[im 1/2]
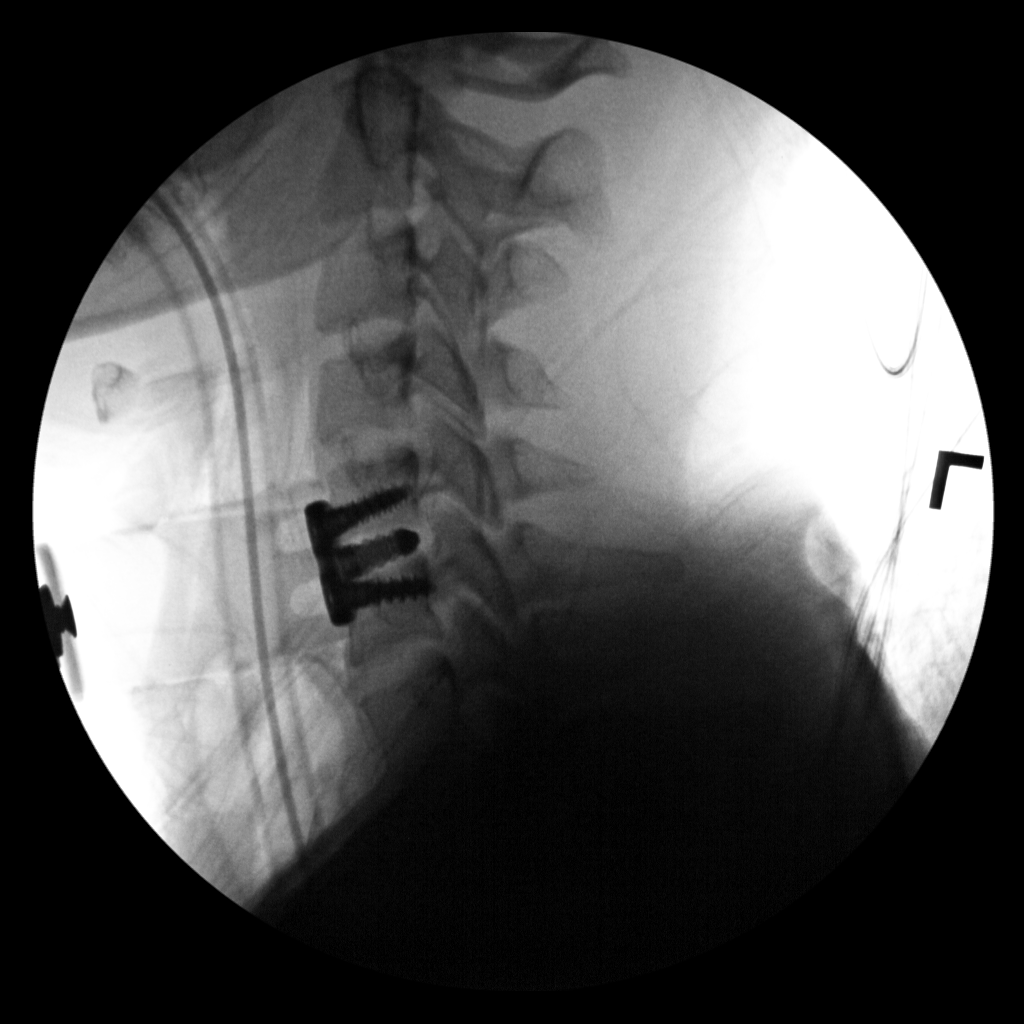
[im 2/2]
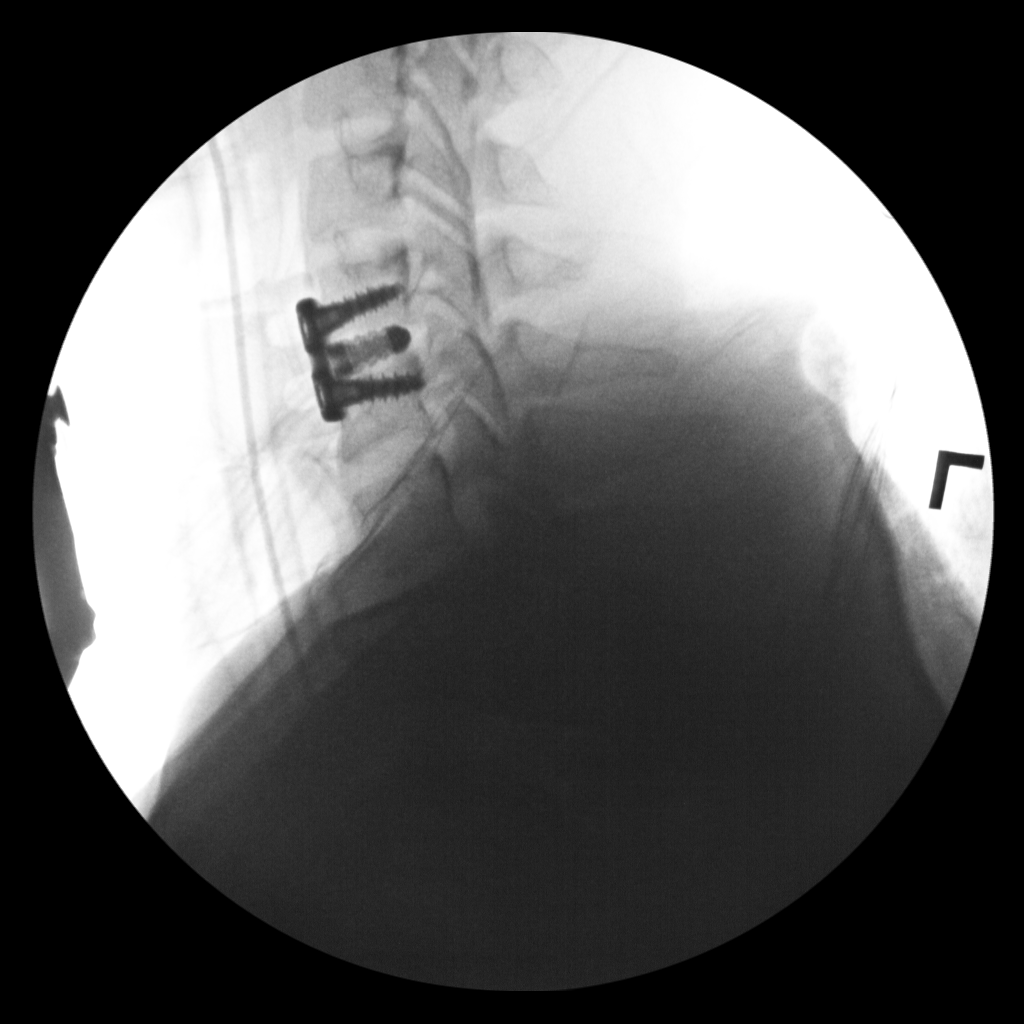

[2 of 2 positions shown; findings below may reference images not displayed]

FINDINGS: Two intraoperative lateral view fluoroscopic images of the cervical
spine are submitted. On the provided images, there is ACDF hardware
(ventral plate and screws, as well as interbody device) at C5-C6.
Partially visualized ET tube.
IMPRESSION: Two lateral view intraoperative fluoroscopic images of the cervical
spine from C5-C6 ACDF, as described.

## 2022-05-24 DIAGNOSIS — Z419 Encounter for procedure for purposes other than remedying health state, unspecified: Secondary | ICD-10-CM | POA: Diagnosis not present

## 2022-06-24 DIAGNOSIS — Z419 Encounter for procedure for purposes other than remedying health state, unspecified: Secondary | ICD-10-CM | POA: Diagnosis not present

## 2022-07-24 DIAGNOSIS — Z419 Encounter for procedure for purposes other than remedying health state, unspecified: Secondary | ICD-10-CM | POA: Diagnosis not present

## 2022-08-24 DIAGNOSIS — Z419 Encounter for procedure for purposes other than remedying health state, unspecified: Secondary | ICD-10-CM | POA: Diagnosis not present

## 2022-09-23 DIAGNOSIS — Z419 Encounter for procedure for purposes other than remedying health state, unspecified: Secondary | ICD-10-CM | POA: Diagnosis not present

## 2022-10-24 DIAGNOSIS — Z419 Encounter for procedure for purposes other than remedying health state, unspecified: Secondary | ICD-10-CM | POA: Diagnosis not present

## 2022-11-24 DIAGNOSIS — Z419 Encounter for procedure for purposes other than remedying health state, unspecified: Secondary | ICD-10-CM | POA: Diagnosis not present

## 2022-12-24 DIAGNOSIS — Z419 Encounter for procedure for purposes other than remedying health state, unspecified: Secondary | ICD-10-CM | POA: Diagnosis not present

## 2023-01-24 DIAGNOSIS — Z419 Encounter for procedure for purposes other than remedying health state, unspecified: Secondary | ICD-10-CM | POA: Diagnosis not present

## 2023-02-23 DIAGNOSIS — Z419 Encounter for procedure for purposes other than remedying health state, unspecified: Secondary | ICD-10-CM | POA: Diagnosis not present

## 2023-03-26 DIAGNOSIS — Z419 Encounter for procedure for purposes other than remedying health state, unspecified: Secondary | ICD-10-CM | POA: Diagnosis not present

## 2023-04-15 ENCOUNTER — Encounter: Payer: Self-pay | Admitting: Advanced Practice Midwife

## 2023-04-26 DIAGNOSIS — Z419 Encounter for procedure for purposes other than remedying health state, unspecified: Secondary | ICD-10-CM | POA: Diagnosis not present

## 2023-05-18 ENCOUNTER — Other Ambulatory Visit: Payer: Self-pay

## 2023-05-18 ENCOUNTER — Encounter (HOSPITAL_COMMUNITY): Payer: Self-pay

## 2023-05-18 ENCOUNTER — Emergency Department (HOSPITAL_COMMUNITY)
Admission: EM | Admit: 2023-05-18 | Discharge: 2023-05-18 | Disposition: A | Discharge status: Home / Self Care | Payer: Medicaid Other | Attending: Emergency Medicine | Admitting: Emergency Medicine

## 2023-05-18 ENCOUNTER — Emergency Department (HOSPITAL_COMMUNITY): Payer: Medicaid Other

## 2023-05-18 DIAGNOSIS — R197 Diarrhea, unspecified: Secondary | ICD-10-CM | POA: Insufficient documentation

## 2023-05-18 DIAGNOSIS — R112 Nausea with vomiting, unspecified: Secondary | ICD-10-CM | POA: Insufficient documentation

## 2023-05-18 DIAGNOSIS — R1084 Generalized abdominal pain: Secondary | ICD-10-CM | POA: Diagnosis not present

## 2023-05-18 DIAGNOSIS — R7401 Elevation of levels of liver transaminase levels: Secondary | ICD-10-CM | POA: Diagnosis not present

## 2023-05-18 DIAGNOSIS — R109 Unspecified abdominal pain: Secondary | ICD-10-CM | POA: Diagnosis not present

## 2023-05-18 LAB — CBC
HCT: 44.3 % (ref 36.0–46.0)
Hemoglobin: 14.7 g/dL (ref 12.0–15.0)
MCH: 30.8 pg (ref 26.0–34.0)
MCHC: 33.2 g/dL (ref 30.0–36.0)
MCV: 92.9 fL (ref 80.0–100.0)
Platelets: 241 10*3/uL (ref 150–400)
RBC: 4.77 MIL/uL (ref 3.87–5.11)
RDW: 12.1 % (ref 11.5–15.5)
WBC: 10.5 10*3/uL (ref 4.0–10.5)
nRBC: 0 % (ref 0.0–0.2)

## 2023-05-18 LAB — URINALYSIS, ROUTINE W REFLEX MICROSCOPIC
Bilirubin Urine: NEGATIVE
Glucose, UA: NEGATIVE mg/dL
Hgb urine dipstick: NEGATIVE
Ketones, ur: 20 mg/dL — AB
Leukocytes,Ua: NEGATIVE
Nitrite: NEGATIVE
Protein, ur: NEGATIVE mg/dL
Specific Gravity, Urine: 1.023 (ref 1.005–1.030)
pH: 8 (ref 5.0–8.0)

## 2023-05-18 LAB — RESP PANEL BY RT-PCR (RSV, FLU A&B, COVID)  RVPGX2
Influenza A by PCR: NEGATIVE
Influenza B by PCR: NEGATIVE
Resp Syncytial Virus by PCR: NEGATIVE
SARS Coronavirus 2 by RT PCR: NEGATIVE

## 2023-05-18 LAB — LIPASE, BLOOD: Lipase: 27 U/L (ref 11–51)

## 2023-05-18 LAB — COMPREHENSIVE METABOLIC PANEL
ALT: 49 U/L — ABNORMAL HIGH (ref 0–44)
AST: 66 U/L — ABNORMAL HIGH (ref 15–41)
Albumin: 4.6 g/dL (ref 3.5–5.0)
Alkaline Phosphatase: 53 U/L (ref 38–126)
Anion gap: 10 (ref 5–15)
BUN: 11 mg/dL (ref 6–20)
CO2: 23 mmol/L (ref 22–32)
Calcium: 9.7 mg/dL (ref 8.9–10.3)
Chloride: 104 mmol/L (ref 98–111)
Creatinine, Ser: 0.89 mg/dL (ref 0.44–1.00)
GFR, Estimated: >60 mL/min (ref >60)
Glucose, Bld: 134 mg/dL — ABNORMAL HIGH (ref 70–99)
Potassium: 4 mmol/L (ref 3.5–5.1)
Sodium: 137 mmol/L (ref 135–145)
Total Bilirubin: 1.4 mg/dL — ABNORMAL HIGH (ref 0.0–1.2)
Total Protein: 8.2 g/dL — ABNORMAL HIGH (ref 6.5–8.1)

## 2023-05-18 LAB — HCG, SERUM, QUALITATIVE: Preg, Serum: NEGATIVE

## 2023-05-18 MED ORDER — ALUM & MAG HYDROXIDE-SIMETH 200-200-20 MG/5ML PO SUSP
30.0000 mL | Freq: Once | ORAL | Status: AC
  Administered 2023-05-18: 30 mL via ORAL
  Filled 2023-05-18: qty 30

## 2023-05-18 MED ORDER — ONDANSETRON 4 MG PO TBDP
4.0000 mg | ORAL_TABLET | Freq: Once | ORAL | Status: AC | PRN
  Administered 2023-05-18: 4 mg via ORAL
  Filled 2023-05-18: qty 1

## 2023-05-18 MED ORDER — IOHEXOL 350 MG/ML SOLN
75.0000 mL | Freq: Once | INTRAVENOUS | Status: AC | PRN
  Administered 2023-05-18: 75 mL via INTRAVENOUS

## 2023-05-18 MED ORDER — ONDANSETRON HCL 4 MG PO TABS: 4.0000 mg | ORAL_TABLET | Freq: Three times a day (TID) | ORAL | 0 refills | Status: AC | PRN

## 2023-05-18 MED ORDER — LIDOCAINE VISCOUS HCL 2 % MT SOLN
15.0000 mL | Freq: Once | OROMUCOSAL | Status: AC
  Administered 2023-05-18: 15 mL via ORAL
  Filled 2023-05-18: qty 15

## 2023-05-18 NOTE — ED Notes (Signed)
 Patient given fluids that she has tolerated well.

## 2023-05-18 NOTE — ED Provider Notes (Signed)
 [place] [facility] AT [facility] Provider Note   CSN: [id number] Arrival date & time: 05/18/23  1010     History  Chief Complaint  Patient presents with   Abdominal Pain   Emesis   Diarrhea    Autumn Barr is a 23 y.o. female presents today for abdominal pain with nausea vomiting and diarrhea that began this morning.  Patient has not taken anything to help symptoms at this time.   Abdominal Pain Associated symptoms: diarrhea, nausea and vomiting   Emesis Associated symptoms: abdominal pain and diarrhea   Diarrhea Associated symptoms: abdominal pain and vomiting        Home Medications Prior to Admission medications   Medication Sig Start Date End Date Taking? Authorizing Provider  ondansetron (ZOFRAN) 4 MG tablet Take 1 tablet (4 mg total) by mouth every 8 (eight) hours as needed for nausea or vomiting. 05/18/23  Yes Dolphus Jenny, PA-C  acetaminophen (TYLENOL) 325 MG tablet Take 2 tablets (650 mg total) by mouth every 4 (four) hours as needed (for pain scale < 4). 02/07/22   Autry-Lott, Randa Evens, DO  ibuprofen (ADVIL) 600 MG tablet Take 1 tablet (600 mg total) by mouth every 6 (six) hours. 02/07/22   Autry-Lott, Randa Evens, DO  prenatal vitamin w/FE, FA (PRENATAL 1 + 1) 27-1 MG TABS tablet Take 1 tablet by mouth daily at 12 noon. 10/24/21   Bernerd Limbo, CNM      Allergies    Patient has no known allergies.    Review of Systems   Review of Systems  Gastrointestinal:  Positive for abdominal pain, diarrhea, nausea and vomiting.    Physical Exam Updated Vital Signs BP 107/77   Pulse 87   Temp (!) 97.4 F (36.3 C)   Resp 20   Ht 5\' 2"  (1.575 m)   Wt 63 kg   SpO2 100%   Breastfeeding No   BMI 25.42 kg/m  Physical Exam Vitals and nursing note reviewed.  Constitutional:      General: She is not in acute distress.    Appearance: She is well-developed and normal weight. She is ill-appearing. She is not toxic-appearing or diaphoretic.   HENT:     Head: Normocephalic and atraumatic.     Mouth/Throat:     Mouth: Mucous membranes are moist.  Eyes:     Extraocular Movements: Extraocular movements intact.     Conjunctiva/sclera: Conjunctivae normal.  Cardiovascular:     Rate and Rhythm: Normal rate and regular rhythm.     Heart sounds: Normal heart sounds. No murmur heard. Pulmonary:     Effort: Pulmonary effort is normal. No respiratory distress.     Breath sounds: Normal breath sounds.  Abdominal:     General: Abdomen is flat. Bowel sounds are normal.     Palpations: Abdomen is soft.     Tenderness: There is generalized abdominal tenderness and tenderness in the epigastric area.  Musculoskeletal:        General: No swelling.     Cervical back: Neck supple.  Skin:    General: Skin is warm and dry.     Capillary Refill: Capillary refill takes less than 2 seconds.  Neurological:     General: No focal deficit present.     Mental Status: She is alert.     Motor: No weakness.  Psychiatric:        Mood and Affect: Mood normal.     ED Results / Procedures / Treatments  Labs (all labs ordered are listed, but only abnormal results are displayed) Labs Reviewed  COMPREHENSIVE METABOLIC PANEL - Abnormal; Notable for the following components:      Result Value   Glucose, Bld 134 (*)    Total Protein 8.2 (*)    AST 66 (*)    ALT 49 (*)    Total Bilirubin 1.4 (*)    All other components within normal limits  URINALYSIS, ROUTINE W REFLEX MICROSCOPIC - Abnormal; Notable for the following components:   APPearance HAZY (*)    Ketones, ur 20 (*)    All other components within normal limits  RESP PANEL BY RT-PCR (RSV, FLU A&B, COVID)  RVPGX2  LIPASE, BLOOD  CBC  HCG, SERUM, QUALITATIVE    EKG None  Radiology CT ABDOMEN PELVIS W CONTRAST Result Date: 05/18/2023 CLINICAL DATA:  Abdominal pain, acute, nonlocalized EXAM: CT ABDOMEN AND PELVIS WITH CONTRAST TECHNIQUE: Multidetector CT imaging of the abdomen and  pelvis was performed using the standard protocol following bolus administration of intravenous contrast. RADIATION DOSE REDUCTION: This exam was performed according to the departmental dose-optimization program which includes automated exposure control, adjustment of the mA and/or kV according to patient size and/or use of iterative reconstruction technique. CONTRAST:  75mL OMNIPAQUE IOHEXOL 350 MG/ML SOLN COMPARISON:  None Available. FINDINGS: Lower chest: Included lung bases are clear.  Heart size is normal. Hepatobiliary: No focal liver abnormality is seen. No gallstones, gallbladder wall thickening, or biliary dilatation. Pancreas: Unremarkable. No pancreatic ductal dilatation or surrounding inflammatory changes. Spleen: Normal in size without focal abnormality. Adrenals/Urinary Tract: Unremarkable adrenal glands. Kidneys enhance symmetrically without focal lesion, stone, or hydronephrosis. Ureters are nondilated. Urinary bladder appears unremarkable for the degree of distention. Stomach/Bowel: Stomach is within normal limits. Appendix appears normal (series 5, image 43). There are several fluid-filled, nondilated loops of small bowel. No evidence of bowel wall thickening, distention, or inflammatory changes. Vascular/Lymphatic: No significant vascular findings are present. No enlarged abdominal or pelvic lymph nodes. Reproductive: Uterus and bilateral adnexa are unremarkable. Other: Small volume free fluid within the cul-de-sac. No organized abdominopelvic fluid collection. No pneumoperitoneum. Musculoskeletal: No acute or significant osseous findings. IMPRESSION: 1. Several fluid-filled, non-dilated loops of small bowel, which can be seen in the setting of enteritis. 2. Small volume free fluid within the cul-de-sac, likely physiologic. 3. Otherwise, no acute abdominopelvic findings. Electronically Signed   By: Duanne Guess D.O.   On: 05/18/2023 14:18    Procedures Procedures    Medications Ordered  in ED Medications  ondansetron (ZOFRAN-ODT) disintegrating tablet 4 mg (4 mg Oral Given 05/18/23 1032)  alum & mag hydroxide-simeth (MAALOX/MYLANTA) 200-200-20 MG/5ML suspension 30 mL (30 mLs Oral Given 05/18/23 1255)    And  lidocaine (XYLOCAINE) 2 % viscous mouth solution 15 mL (15 mLs Oral Given 05/18/23 1255)  iohexol (OMNIPAQUE) 350 MG/ML injection 75 mL (75 mLs Intravenous Contrast Given 05/18/23 1340)    ED Course/ Medical Decision Making/ A&P                                 Medical Decision Making Amount and/or Complexity of Data Reviewed Labs: ordered. Radiology: ordered.  Risk OTC drugs. Prescription drug management.   This patient presents to the ED with chief complaint(s) of abdominal pain with nausea vomiting and diarrhea with pertinent past medical history of none which further complicates the presenting complaint. The complaint involves an extensive differential diagnosis and also carries  with it a high risk of complications and morbidity.    The differential diagnosis includes COVID, flu, RSV, GI illness, appendicitis, pancreatitis, choledocholithiasis, diverticulitis, SBO, pregnancy  Additional history obtained: Records reviewed OB/GYN notes  ED Course and Reassessment: Patient able to tolerate p.o. intake prior to discharge without issue  Independent labs interpretation:  The following labs were independently interpreted:  CBC: No notable findings CMP: Elevated AST at 66, elevated ALT of 49 Lipase: 27 hCG serum: Negative UA: 20 ketones Respiratory panel: Negative  Independent visualization of imaging: - I independently visualized the following imaging with scope of interpretation limited to determining acute life threatening conditions related to emergency care: CT abdomen pelvis with contrast, which revealed several fluid-filled, nondilated loops of small bowel, can be seen in the setting of enteritis.  Small volume free fluid within the cul-de-sac, likely  physiologic.  Otherwise no acute abdominopelvic findings.  Consultation: - Consulted or discussed management/test interpretation w/ external professional: None  Consideration for admission or further workup: Considered for admission or further workup however patient's vital signs, physical exam, labs, and imaging were reassuring.  Patient's symptoms likely due to GI illness.  Patient able to tolerate p.o. intake prior to discharge.  Patient will be given outpatient course of Zofran as needed for nausea and vomiting and counseled to take Imodium over-the-counter for diarrhea.        Final Clinical Impression(s) / ED Diagnoses Final diagnoses:  Nausea vomiting and diarrhea  Generalized abdominal pain    Rx / DC Orders ED Discharge Orders          Ordered    ondansetron (ZOFRAN) 4 MG tablet  Every 8 hours PRN        05/18/23 1425              Dolphus Jenny, [place] 05/18/23 1458    Tegeler, Canary Brim, MD 05/18/23 1524

## 2023-05-18 NOTE — ED Notes (Signed)
 Patient transported to CT

## 2023-05-18 NOTE — ED Triage Notes (Signed)
 Pt c/o abdominal pain and n/v/d starting this morning.  Pain score 10/10.  Pt has not taken anything to help symptoms.

## 2023-05-18 NOTE — Discharge Instructions (Addendum)
 Today you are seen for abdominal pain with nausea, vomiting, and diarrhea.  Please pick up your Zofran and take as needed for nausea and vomiting.  You may take over-the-counter Imodium conservatively as needed for diarrhea.  Please maintain adequate hydration.  Thank you for letting us treat you today. After reviewing your labs and imaging, I feel you are safe to go home. Please follow up with your PCP in the next several days and provide them with your records from this visit. Return to the Emergency Room if pain becomes severe or symptoms worsen.

## 2023-05-24 DIAGNOSIS — Z419 Encounter for procedure for purposes other than remedying health state, unspecified: Secondary | ICD-10-CM | POA: Diagnosis not present

## 2023-07-05 DIAGNOSIS — Z419 Encounter for procedure for purposes other than remedying health state, unspecified: Secondary | ICD-10-CM | POA: Diagnosis not present

## 2023-08-04 DIAGNOSIS — Z419 Encounter for procedure for purposes other than remedying health state, unspecified: Secondary | ICD-10-CM | POA: Diagnosis not present

## 2023-09-04 DIAGNOSIS — Z419 Encounter for procedure for purposes other than remedying health state, unspecified: Secondary | ICD-10-CM | POA: Diagnosis not present

## 2023-10-04 DIAGNOSIS — Z419 Encounter for procedure for purposes other than remedying health state, unspecified: Secondary | ICD-10-CM | POA: Diagnosis not present

## 2023-11-04 DIAGNOSIS — Z419 Encounter for procedure for purposes other than remedying health state, unspecified: Secondary | ICD-10-CM | POA: Diagnosis not present

## 2023-12-05 DIAGNOSIS — Z419 Encounter for procedure for purposes other than remedying health state, unspecified: Secondary | ICD-10-CM | POA: Diagnosis not present

## 2024-01-04 DIAGNOSIS — Z419 Encounter for procedure for purposes other than remedying health state, unspecified: Secondary | ICD-10-CM | POA: Diagnosis not present
